# Patient Record
Sex: Female | Born: 2000 | Hispanic: No | Marital: Single | Smoking: Never smoker
Health system: Southern US, Community
[De-identification: ages and names within clinical notes are randomized; demographics above are authoritative.]

---
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
## 2023-03-04 ENCOUNTER — Encounter (HOSPITAL_COMMUNITY): Payer: Self-pay

## 2023-03-04 ENCOUNTER — Other Ambulatory Visit: Payer: Self-pay

## 2023-03-04 ENCOUNTER — Emergency Department (HOSPITAL_COMMUNITY)
Admission: EM | Admit: 2023-03-04 | Discharge: 2023-03-04 | Disposition: A | Discharge status: Home / Self Care | Payer: 59 | Attending: Emergency Medicine | Admitting: Emergency Medicine

## 2023-03-04 DIAGNOSIS — L509 Urticaria, unspecified: Secondary | ICD-10-CM | POA: Diagnosis present

## 2023-03-04 LAB — CBC WITH DIFFERENTIAL/PLATELET
Abs Immature Granulocytes: 0.04 10*3/uL (ref 0.00–0.07)
Basophils Absolute: 0 10*3/uL (ref 0.0–0.1)
Basophils Relative: 0 %
Eosinophils Absolute: 0 10*3/uL (ref 0.0–0.5)
Eosinophils Relative: 0 %
HCT: 42.1 % (ref 36.0–46.0)
Hemoglobin: 14 g/dL (ref 12.0–15.0)
Immature Granulocytes: 0 %
Lymphocytes Relative: 17 %
Lymphs Abs: 2.6 10*3/uL (ref 0.7–4.0)
MCH: 27.3 pg (ref 26.0–34.0)
MCHC: 33.3 g/dL (ref 30.0–36.0)
MCV: 82.1 fL (ref 80.0–100.0)
Monocytes Absolute: 1 10*3/uL (ref 0.1–1.0)
Monocytes Relative: 6 %
Neutro Abs: 11.9 10*3/uL — ABNORMAL HIGH (ref 1.7–7.7)
Neutrophils Relative %: 77 %
Platelets: 467 10*3/uL — ABNORMAL HIGH (ref 150–400)
RBC: 5.13 MIL/uL — ABNORMAL HIGH (ref 3.87–5.11)
RDW: 13 % (ref 11.5–15.5)
WBC: 15.6 10*3/uL — ABNORMAL HIGH (ref 4.0–10.5)
nRBC: 0 % (ref 0.0–0.2)

## 2023-03-04 LAB — BASIC METABOLIC PANEL
Anion gap: 10 (ref 5–15)
BUN: 10 mg/dL (ref 6–20)
CO2: 20 mmol/L — ABNORMAL LOW (ref 22–32)
Calcium: 8.9 mg/dL (ref 8.9–10.3)
Chloride: 104 mmol/L (ref 98–111)
Creatinine, Ser: 0.71 mg/dL (ref 0.44–1.00)
GFR, Estimated: >60 mL/min (ref >60)
Glucose, Bld: 98 mg/dL (ref 70–99)
Potassium: 3.8 mmol/L (ref 3.5–5.1)
Sodium: 134 mmol/L — ABNORMAL LOW (ref 135–145)

## 2023-03-04 LAB — POC URINE PREG, ED: Preg Test, Ur: NEGATIVE

## 2023-03-04 MED ORDER — FAMOTIDINE 20 MG PO TABS: 20.0000 mg | ORAL_TABLET | Freq: Two times a day (BID) | ORAL | 0 refills | Status: AC

## 2023-03-04 MED ORDER — EPINEPHRINE 0.3 MG/0.3ML IJ SOAJ
0.3000 mg | INTRAMUSCULAR | 0 refills | Status: AC | PRN
Start: 2023-03-04 — End: ?

## 2023-03-04 MED ORDER — DEXAMETHASONE SODIUM PHOSPHATE 10 MG/ML IJ SOLN
10.0000 mg | Freq: Once | INTRAMUSCULAR | Status: AC
  Administered 2023-03-04: 10 mg via INTRAVENOUS
  Filled 2023-03-04: qty 1

## 2023-03-04 MED ORDER — DIPHENHYDRAMINE HCL 50 MG/ML IJ SOLN
25.0000 mg | Freq: Once | INTRAMUSCULAR | Status: AC
  Administered 2023-03-04: 25 mg via INTRAVENOUS
  Filled 2023-03-04: qty 1

## 2023-03-04 MED ORDER — FAMOTIDINE 20 MG PO TABS
40.0000 mg | ORAL_TABLET | Freq: Once | ORAL | Status: AC
  Administered 2023-03-04: 40 mg via ORAL
  Filled 2023-03-04: qty 2

## 2023-03-04 MED ORDER — DIPHENHYDRAMINE HCL 25 MG PO TABS: 25.0000 mg | ORAL_TABLET | Freq: Four times a day (QID) | ORAL | 0 refills | Status: AC

## 2023-03-04 MED ORDER — PREDNISONE 10 MG PO TABS: 40.0000 mg | ORAL_TABLET | Freq: Every day | ORAL | 0 refills | Status: AC

## 2023-03-04 NOTE — Discharge Instructions (Addendum)
Please take your medications including famotidine, prednisone and diphenhydramine as prescribed (take prednisone until finish). Take tylenol/ibuprofen for pain. Use Epipen if you have trouble breathing, tongue swelling, neck swelling, throat closing. I recommend close follow-up with PCP for questions about your contraceptive medication.  Please do not hesitate to return to emergency department if worrisome signs symptoms we discussed become apparent.

## 2023-03-04 NOTE — ED Provider Notes (Signed)
[place] EMERGENCY DEPARTMENT AT [facility] Provider Note   CSN: [id number] Arrival date & time: 03/04/23  1959     History {Add pertinent medical, surgical, social history, OB history to HPI:1} Chief Complaint  Patient presents with   Urticaria    Kishara Loveland is a 22 y.o. female otherwise healthy presents today for evaluation of a urticaria.  Patient reports that symptoms started yesterday. States the rash started on her back then spread all over her body. She took some Benadryl this morning which improved symptoms however symptoms returned a few hours later.  She took another dose of Benadryl around 11 AM today with no relief.  Patient reports that her PCP just started her on new contraceptive medication (drospirenone). States she started taking that medication a week ago.  No other change in medication.  Denies fever, nausea, vomiting, abdominal pain, bowel changes, urinary symptoms.  She denies any neck swelling, tongue swelling, trouble breathing.   Urticaria    History reviewed. No pertinent past medical history. History reviewed. No pertinent surgical history.   Home Medications Prior to Admission medications   Not on File      Allergies    Cefatrizine    Review of Systems   Review of Systems Negative except as per HPI.  Physical Exam Updated Vital Signs BP 130/71   Pulse 97   Temp 97.9 F (36.6 C) (Oral)   Resp 20   Ht 5\' 5"  (1.651 m)   Wt 104 kg   SpO2 98%   BMI 38.15 kg/m  Physical Exam Vitals and nursing note reviewed.  Constitutional:      Appearance: Normal appearance.  HENT:     Head: Normocephalic and atraumatic.     Mouth/Throat:     Mouth: Mucous membranes are moist.  Eyes:     General: No scleral icterus. Cardiovascular:     Rate and Rhythm: Normal rate and regular rhythm.     Pulses: Normal pulses.     Heart sounds: Normal heart sounds.  Pulmonary:     Effort: Pulmonary effort is normal.     Breath sounds: Normal  breath sounds.  Abdominal:     General: Abdomen is flat.     Palpations: Abdomen is soft.     Tenderness: There is no abdominal tenderness.  Musculoskeletal:        General: No deformity.  Skin:    General: Skin is warm.     Findings: No rash.     Comments: Urticarial rash on trunk, back, arms, legs and face.  Neurological:     General: No focal deficit present.     Mental Status: She is alert.  Psychiatric:        Mood and Affect: Mood normal.     ED Results / Procedures / Treatments   Labs (all labs ordered are listed, but only abnormal results are displayed) Labs Reviewed  CBC WITH DIFFERENTIAL/PLATELET  BASIC METABOLIC PANEL  POC URINE PREG, ED    EKG None  Radiology No results found.  Procedures Procedures  {Document cardiac monitor, telemetry assessment procedure when appropriate:1}  Medications Ordered in ED Medications  diphenhydrAMINE (BENADRYL) injection 25 mg (25 mg Intravenous Given 03/04/23 2253)  dexamethasone (DECADRON) injection 10 mg (10 mg Intravenous Given 03/04/23 2253)    ED Course/ Medical Decision Making/ A&P   {   Click here for ABCD2, HEART and other calculatorsREFRESH Note before signing :1}  Medical Decision Making Amount and/or Complexity of Data Reviewed Labs: ordered.  Risk Prescription drug management.     {Document critical care time when appropriate:1} {Document review of labs and clinical decision tools ie heart score, Chads2Vasc2 etc:1}  {Document your independent review of radiology images, and any outside records:1} {Document your discussion with family members, caretakers, and with consultants:1} {Document social determinants of health affecting pt's care:1} {Document your decision making why or why not admission, treatments were needed:1} Final Clinical Impression(s) / ED Diagnoses Final diagnoses:  None    Rx / DC Orders ED Discharge Orders     None

## 2023-03-04 NOTE — ED Triage Notes (Signed)
Pt states she has hives all over her body starting last night.  Tried to treat it at home with benadryl but it is spreading; getting worse with no improvement.  Denies SOB, N/V/D.

## 2024-02-06 ENCOUNTER — Encounter: Payer: Self-pay | Admitting: Advanced Practice Midwife
